# Patient Record
Sex: Male | Born: 1995 | Hispanic: No | Marital: Single | Smoking: Never smoker
Health system: Southern US, Community
[De-identification: ages and names within clinical notes are randomized; demographics above are authoritative.]

## PROBLEM LIST (undated history)

## (undated) DIAGNOSIS — F840 Autistic disorder: Secondary | ICD-10-CM

---
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
## 2014-03-07 ENCOUNTER — Emergency Department (HOSPITAL_COMMUNITY)
Admission: EM | Admit: 2014-03-07 | Discharge: 2014-03-07 | Disposition: A | Discharge status: Home / Self Care | Payer: Medicaid Other | Attending: Emergency Medicine | Admitting: Emergency Medicine

## 2014-03-07 ENCOUNTER — Encounter (HOSPITAL_COMMUNITY): Payer: Self-pay | Admitting: Emergency Medicine

## 2014-03-07 DIAGNOSIS — H9209 Otalgia, unspecified ear: Secondary | ICD-10-CM | POA: Insufficient documentation

## 2014-03-07 DIAGNOSIS — H9202 Otalgia, left ear: Secondary | ICD-10-CM

## 2014-03-07 DIAGNOSIS — Z792 Long term (current) use of antibiotics: Secondary | ICD-10-CM | POA: Insufficient documentation

## 2014-03-07 DIAGNOSIS — F840 Autistic disorder: Secondary | ICD-10-CM | POA: Insufficient documentation

## 2014-03-07 DIAGNOSIS — Z79899 Other long term (current) drug therapy: Secondary | ICD-10-CM | POA: Insufficient documentation

## 2014-03-07 MED ORDER — HYDROCODONE-ACETAMINOPHEN 7.5-325 MG/15ML PO SOLN: 15.0000 mL | Freq: Three times a day (TID) | ORAL | Status: DC | PRN

## 2014-03-07 MED ORDER — AMOXICILLIN 250 MG PO CHEW: 500.0000 mg | CHEWABLE_TABLET | Freq: Three times a day (TID) | ORAL | Status: DC

## 2014-03-07 NOTE — ED Provider Notes (Signed)
CSN: [id number]     Arrival date & time 03/07/14  1913 History  This chart was scribed for non-physician practitioner Arthor Captain, PA-C  working with Toy Baker, MD by Elveria Rising, ED Scribe. This patient was seen in room TR05C/TR05C and the patient's care was started at 9:15 PM.   Chief Complaint  Patient presents with  . Otalgia     The history is provided by the patient. No language interpreter was used.   HPI Comments:  Alan Patterson is a 18 y.o. male with history of autism brought in by parents to the Emergency Department complaining of pulling and hitting his right ear for several days.  Patient is nonverbal and hx given by parents. Decreased sleep and agitation due to pain.Parents suspect the patient has an ear infection.      Past Medical History  Diagnosis Date  . Autism    History reviewed. No pertinent past surgical history. No family history on file. History  Substance Use Topics  . Smoking status: Never Smoker   . Smokeless tobacco: Not on file  . Alcohol Use: No    Review of Systems  Constitutional: Positive for activity change. Negative for fever and appetite change.  HENT: Positive for ear pain. Negative for ear discharge and hearing loss.   Psychiatric/Behavioral: Positive for agitation.      Allergies  Review of patient's allergies indicates no known allergies.  Home Medications   Prior to Admission medications   Medication Sig Start Date End Date Taking? Authorizing Provider  ARIPiprazole (ABILIFY) 10 MG tablet Take 10-20 mg by mouth 2 (two) times daily. 1 tablet in the morning and 2 tablets at bedtime    Historical Provider, MD  cloNIDine (CATAPRES) 0.1 MG tablet Take 0.1 mg by mouth 3 (three) times daily.    Historical Provider, MD  divalproex (DEPAKOTE SPRINKLE) 125 MG capsule Take 250 mg by mouth 4 (four) times daily.     Historical Provider, MD  doxycycline (VIBRAMYCIN) 100 MG capsule Take 1 capsule (100 mg total) by mouth 2 (two)  times daily. 09/15/12   Jamesetta Orleans Lawyer, PA-C  LORazepam (ATIVAN) 1 MG tablet Take 1 mg by mouth every 8 (eight) hours.    Historical Provider, MD   Triage Vitals: BP 121/75  Pulse 98  Temp(Src) 98.9 F (37.2 C) (Oral)  Resp 18  Ht 5\' 9"  (1.753 m)  Wt 200 lb (90.719 kg)  BMI 29.52 kg/m2  SpO2 96% Physical Exam  Nursing note and vitals reviewed. Constitutional: He is oriented to person, place, and time. He appears well-developed and well-nourished. No distress.  HENT:  Head: Normocephalic and atraumatic.  TMs obscured by waxy bilaterally cerumen. Draws away from speculum on left ear. 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  Neck: Neck supple. No tracheal deviation present.  Cardiovascular: Normal rate.   Pulmonary/Chest: Effort normal. No respiratory distress.  Musculoskeletal: Normal range of motion.  Neurological: He is alert and oriented to person, place, and time.  Skin: Skin is warm and dry.  Psychiatric: He has a normal mood and affect. His behavior is normal.    ED Course  Procedures (including critical care time) DIAGNOSTIC STUDIES: Oxygen Saturation is 96% on room air, adequate by my interpretation.    COORDINATION OF CARE: 9:15 PM- Discussed treatment plan with patient at bedside and patient agreed to plan.     Labs Review Labs Reviewed - No data to display  Imaging Review No results found.   EKG Interpretation None      MDM   Final diagnoses:  Otalgia of left ear    Patient with sig. Cerumen BL. Assume infection and will treat as patient is more and more agitated as time goes on ine ED. I have advised the parents that they will need to have his cerumen removed and advise ENT f/u. Patient pain and agitation prevent cerumen removal this evening.  I personally performed the services described in this documentation, which was scribed in my presence. The recorded information has been  reviewed and is accurate.    Arthor CaptainAbigail Braydn Carneiro, PA-C 03/12/14 2203

## 2014-03-07 NOTE — ED Notes (Signed)
Pt.'s parents reported that pt. is pulling/hitting his right ear for several days suspects ear infection , pt. is autistic , no drainage or fever .

## 2014-03-07 NOTE — ED Notes (Signed)
Pt dc with family

## 2014-03-07 NOTE — Discharge Instructions (Signed)
Otalgia °The most common reason for this in children is an infection of the middle ear. Pain from the middle ear is usually caused by a build-up of fluid and pressure behind the eardrum. Pain from an earache can be sharp, dull, or burning. The pain may be temporary or constant. The middle ear is connected to the nasal passages by a short narrow tube called the Eustachian tube. The Eustachian tube allows fluid to drain out of the middle ear, and helps keep the pressure in your ear equalized. °CAUSES  °A cold or allergy can block the Eustachian tube with inflammation and the build-up of secretions. This is especially likely in small children, because their Eustachian tube is shorter and more horizontal. When the Eustachian tube closes, the normal flow of fluid from the middle ear is stopped. Fluid can accumulate and cause stuffiness, pain, hearing loss, and an ear infection if germs start growing in this area. °SYMPTOMS  °The symptoms of an ear infection may include fever, ear pain, fussiness, increased crying, and irritability. Many children will have temporary and minor hearing loss during and right after an ear infection. Permanent hearing loss is rare, but the risk increases the more infections a child has. Other causes of ear pain include retained water in the outer ear canal from swimming and bathing. °Ear pain in adults is less likely to be from an ear infection. Ear pain may be referred from other locations. Referred pain may be from the joint between your jaw and the skull. It may also come from a tooth problem or problems in the neck. Other causes of ear pain include: °· A foreign body in the ear. °· Outer ear infection. °· Sinus infections. °· Impacted ear wax. °· Ear injury. °· Arthritis of the jaw or TMJ problems. °· Middle ear infection. °· Tooth infections. °· Sore throat with pain to the ears. °DIAGNOSIS  °Your caregiver can usually make the diagnosis by examining you. Sometimes other special studies,  including x-rays and lab work may be necessary. °TREATMENT  °· If antibiotics were prescribed, use them as directed and finish them even if you or your child's symptoms seem to be improved. °· Sometimes PE tubes are needed in children. These are little plastic tubes which are put into the eardrum during a simple surgical procedure. They allow fluid to drain easier and allow the pressure in the middle ear to equalize. This helps relieve the ear pain caused by pressure changes. °HOME CARE INSTRUCTIONS  °· Only take over-the-counter or prescription medicines for pain, discomfort, or fever as directed by your caregiver. DO NOT GIVE CHILDREN ASPIRIN because of the association of Reye's Syndrome in children taking aspirin. °· Use a cold pack applied to the outer ear for 15-20 minutes, 03-04 times per day or as needed may reduce pain. Do not apply ice directly to the skin. You may cause frost bite. °· Over-the-counter ear drops used as directed may be effective. Your caregiver may sometimes prescribe ear drops. °· Resting in an upright position may help reduce pressure in the middle ear and relieve pain. °· Ear pain caused by rapidly descending from high altitudes can be relieved by swallowing or chewing gum. Allowing infants to suck on a bottle during airplane travel can help. °· Do not smoke in the house or near children. If you are unable to quit smoking, smoke outside. °· Control allergies. °SEEK IMMEDIATE MEDICAL CARE IF:  °· You or your child are becoming sicker. °· Pain or fever   relief is not obtained with medicine.  You or your child's symptoms (pain, fever, or irritability) do not improve within 24 to 48 hours or as instructed.  Severe pain suddenly stops hurting. This may indicate a ruptured eardrum.  You or your children develop new problems such as severe headaches, stiff neck, difficulty swallowing, or swelling of the face or around the ear. Document Released: 05/05/2004 Document Revised: 12/11/2011  Document Reviewed: 09/09/2008 [facility] Patient Information 2014 [place], [place]. Amoxicillin chewable tablets What is this medicine? AMOXICILLIN (a mox i SIL in) is a penicillin antibiotic. It is used to treat certain kinds of bacterial infections. It will not work for colds, flu, or other viral infections. This medicine may be used for other purposes; ask your health care provider or pharmacist if you have questions. COMMON BRAND NAME(S): Amoxil What should I tell my health care provider before I take this medicine? They need to know if you have any of these conditions: -asthma -kidney disease -phenylketonuria -an unusual or allergic reaction to amoxicillin, other penicillins, cephalosporin antibiotics, other medicines, foods, dyes, or preservatives -pregnant or trying to get pregnant -breast-feeding How should I use this medicine? Take this medicine by mouth. Chew it completely before swallowing. Follow the directions on your prescription label. You may take this medicine with food or on an empty stomach. Take your medicine at regular intervals. Do not take your medicine more often than directed. Take all of your medicine as directed even if you think your are better. Do not skip doses or stop your medicine early. Talk to your pediatrician regarding the use of this medicine in children. While this drug may be prescribed for selected conditions, precautions do apply. Overdosage: If you think you have taken too much of this medicine contact a poison control center or emergency room at once. NOTE: This medicine is only for you. Do not share this medicine with others. What if I miss a dose? If you miss a dose, take it as soon as you can. If it is almost time for your next dose, take only that dose. Do not take double or extra doses. What may interact with this medicine? -amiloride -birth control pills -chloramphenicol -macrolides -probenecid -sulfonamides -tetracyclines This list may not  describe all possible interactions. Give your health care provider a list of all the medicines, herbs, non-prescription drugs, or dietary supplements you use. Also tell them if you smoke, drink alcohol, or use illegal drugs. Some items may interact with your medicine. What should I watch for while using this medicine? Tell your doctor or health care professional if your symptoms do not improve in 2 or 3 days. Take all of the doses of your medicine as directed. Do not skip doses or stop your medicine early. If you are diabetic, you may get a false positive result for sugar in your urine with certain brands of urine tests. Check with your doctor. Do not treat diarrhea with over-the-counter products. Contact your doctor if you have diarrhea that lasts more than 2 days or if the diarrhea is severe and watery. What side effects may I notice from receiving this medicine? Side effects that you should report to your doctor or health care professional as soon as possible: -allergic reactions like skin rash, itching or hives, swelling of the face, lips, or tongue -breathing problems -dark urine -redness, blistering, peeling or loosening of the skin, including inside the mouth -seizures -severe or watery diarrhea -trouble passing urine or change in the amount of urine -unusual  bleeding or bruising -unusually weak or tired -yellowing of the eyes or skin Side effects that usually do not require medical attention (report to your doctor or health care professional if they continue or are bothersome): -dizziness -headache -stomach upset -trouble sleeping This list may not describe all possible side effects. Call your doctor for medical advice about side effects. You may report side effects to FDA at 1-800-FDA-1088. Where should I keep my medicine? Keep out of the reach of children. Store at or below 77 degrees F (25 degrees C). Keep container tightly closed. Throw away any unused medicine after the  expiration date. NOTE: This sheet is a summary. It may not cover all possible information. If you have questions about this medicine, talk to your doctor, pharmacist, or health care provider.  2014, Elsevier/Gold Standard. (2007-12-12 11:35:07) Acetaminophen; Hydrocodone oral solution What is this medicine? ACETAMINOPHEN; HYDROCODONE (a set a MEE noe fen; hye droe KOE done) is a pain reliever. It is used to treat mild to moderate pain. This medicine may be used for other purposes; ask your health care provider or pharmacist if you have questions. COMMON BRAND NAME(S): Hycet, Liquicet, Lortab, Zamicet , Zolvit  What should I tell my health care provider before I take this medicine? They need to know if you have any of these conditions: -brain tumor -Crohn's disease, inflammatory bowel disease, or ulcerative colitis -drug abuse or addiction -head injury -heart or circulation problems -if you often drink alcohol -kidney disease or problems going to the bathroom -liver disease -lung disease, asthma, or breathing problems -an unusual or allergic reaction to acetaminophen, hydrocodone, other opioid analgesics, other medicines, foods, dyes, or preservatives -pregnant or trying to get pregnant -breast-feeding How should I use this medicine? Take this medicine by mouth. Use a specially marked spoon or dropper to measure your dose. Ask your pharmacist if you do not have a dropper or measuring spoon. Do not use a household spoon. Follow the directions on the prescription label. If the medicine upsets your stomach, take it with food or milk. Do not take more medicine than you are told to take. Talk to your pediatrician regarding the use of this medicine in children. This medicine is not approved for use in children. Overdosage: If you think you have taken too much of this medicine contact a poison control center or emergency room at once. NOTE: This medicine is only for you. Do not share this medicine  with others. What if I miss a dose? If you miss a dose, take it as soon as you can. If it is almost time for your next dose, take only that dose. Do not take double or extra doses. What may interact with this medicine? -alcohol -antihistamines -isoniazid -medicines for depression, anxiety, or psychotic disturbances -medicines for sleep -muscle relaxants -naltrexone -narcotic medicines (opiates) for pain -phenobarbital -ritonavir -tramadol This list may not describe all possible interactions. Give your health care provider a list of all the medicines, herbs, non-prescription drugs, or dietary supplements you use. Also tell them if you smoke, drink alcohol, or use illegal drugs. Some items may interact with your medicine. What should I watch for while using this medicine? Tell your doctor or health care professional if your pain does not go away, if it gets worse, or if you have new or a different type of pain. You may develop tolerance to the medicine. Tolerance means that you will need a higher dose of the medicine for pain relief. Tolerance is normal and is  expected if you take this medicine for a long time. Do not suddenly stop taking your medicine because you may develop a severe reaction. Your body becomes used to the medicine. This does NOT mean you are addicted. Addiction is a behavior related to getting and using a drug for a non-medical reason. If you have pain, you have a medical reason to take pain medicine. Your doctor will tell you how much medicine to take. If your doctor wants you to stop the medicine, the dose will be slowly lowered over time to avoid any side effects. You may get drowsy or dizzy when you first start taking the medicine or change doses. Do not drive, use machinery, or do anything that may be dangerous until you know how the medicine affects you. Stand or sit up slowly. There are different types of narcotic medicines (opiates) for pain. If you take more than one  type at the same time, you may have more side effects. Give your health care provider a list of all medicines you use. Your doctor will tell you how much medicine to take. Do not take more medicine than directed. Call emergency for help if you have problems breathing. The medicine will cause constipation. Try to have a bowel movement at least every 2 to 3 days. If you do not have a bowel movement for 3 days, call your doctor or health care professional. Too much acetaminophen can be very dangerous. Do not take Tylenol (acetaminophen) or medicines that contain acetaminophen with this medicine. Many non-prescription medicines contain acetaminophen. Always read the labels carefully. What side effects may I notice from receiving this medicine? Side effects that you should report to your doctor or health care professional as soon as possible: -allergic reactions like skin rash, itching or hives, swelling of the face, lips, or tongue -breathing problems -confusion -feeling faint or lightheaded, falls -stomach pain -yellowing of the eyes or skin Side effects that usually do not require medical attention (report to your doctor or health care professional if they continue or are bothersome): -nausea, vomiting -stomach upset This list may not describe all possible side effects. Call your doctor for medical advice about side effects. You may report side effects to FDA at 1-800-FDA-1088. Where should I keep my medicine? Keep out of the reach of children. This medicine can be abused. Keep your medicine in a safe place to protect it from theft. Do not share this medicine with anyone. Selling or giving away this medicine is dangerous and against the law. Store at room temperature between 20 and 25 degrees C (68 and 77 degrees F). Protect from light. Keep container tightly closed.  Throw away any unused medicine after the expiration date. Discard unused medicine and used packaging carefully. Pets and children can  be harmed if they find used or lost packages. NOTE: This sheet is a summary. It may not cover all possible information. If you have questions about this medicine, talk to your doctor, pharmacist, or health care provider.  2014, Elsevier/Gold Standard. (2013-05-12 13:15:28)

## 2014-03-13 NOTE — ED Provider Notes (Signed)
Medical screening examination/treatment/procedure(s) were performed by non-physician practitioner and as supervising physician I was immediately available for consultation/collaboration.  Usama Harkless T Avalynn Bowe, MD 03/13/14 0709 

## 2014-04-30 ENCOUNTER — Emergency Department (HOSPITAL_COMMUNITY)
Admission: EM | Admit: 2014-04-30 | Discharge: 2014-04-30 | Disposition: A | Discharge status: Home / Self Care | Payer: Medicaid Other | Attending: Emergency Medicine | Admitting: Emergency Medicine

## 2014-04-30 ENCOUNTER — Encounter (HOSPITAL_COMMUNITY): Payer: Self-pay | Admitting: Emergency Medicine

## 2014-04-30 DIAGNOSIS — Z792 Long term (current) use of antibiotics: Secondary | ICD-10-CM | POA: Insufficient documentation

## 2014-04-30 DIAGNOSIS — R569 Unspecified convulsions: Secondary | ICD-10-CM | POA: Diagnosis present

## 2014-04-30 DIAGNOSIS — Z79899 Other long term (current) drug therapy: Secondary | ICD-10-CM | POA: Diagnosis not present

## 2014-04-30 DIAGNOSIS — R1110 Vomiting, unspecified: Secondary | ICD-10-CM | POA: Diagnosis not present

## 2014-04-30 DIAGNOSIS — F840 Autistic disorder: Secondary | ICD-10-CM | POA: Diagnosis not present

## 2014-04-30 LAB — COMPREHENSIVE METABOLIC PANEL
ALK PHOS: 76 U/L (ref 39–117)
ALT: 30 U/L (ref 0–53)
ANION GAP: 15 (ref 5–15)
AST: 27 U/L (ref 0–37)
Albumin: 4 g/dL (ref 3.5–5.2)
BUN: 14 mg/dL (ref 6–23)
CHLORIDE: 103 meq/L (ref 96–112)
CO2: 22 mEq/L (ref 19–32)
Calcium: 9.5 mg/dL (ref 8.4–10.5)
Creatinine, Ser: 0.49 mg/dL — ABNORMAL LOW (ref 0.50–1.35)
GFR calc non Af Amer: >90 mL/min (ref >90)
GLUCOSE: 97 mg/dL (ref 70–99)
POTASSIUM: 4.3 meq/L (ref 3.7–5.3)
SODIUM: 140 meq/L (ref 137–147)
TOTAL PROTEIN: 7.6 g/dL (ref 6.0–8.3)
Total Bilirubin: 1 mg/dL (ref 0.3–1.2)

## 2014-04-30 LAB — VALPROIC ACID LEVEL: Valproic Acid Lvl: <10 ug/mL — ABNORMAL LOW (ref 50.0–100.0)

## 2014-04-30 MED ORDER — LORAZEPAM 2 MG/ML IJ SOLN
1.0000 mg | Freq: Once | INTRAMUSCULAR | Status: AC
  Administered 2014-04-30: 1 mg via INTRAMUSCULAR
  Filled 2014-04-30: qty 1

## 2014-04-30 MED ORDER — DIVALPROEX SODIUM 125 MG PO CPSP: 500.0000 mg | ORAL_CAPSULE | Freq: Once | ORAL | Status: DC

## 2014-04-30 MED ORDER — DIVALPROEX SODIUM 125 MG PO CPSP
500.0000 mg | ORAL_CAPSULE | Freq: Once | ORAL | Status: DC
  Filled 2014-04-30: qty 4

## 2014-04-30 MED ORDER — ONDANSETRON 4 MG PO TBDP
4.0000 mg | ORAL_TABLET | Freq: Once | ORAL | Status: AC
  Administered 2014-04-30: 4 mg via ORAL
  Filled 2014-04-30: qty 1

## 2014-04-30 MED ORDER — ONDANSETRON HCL 4 MG/2ML IJ SOLN: 4.0000 mg | Freq: Once | INTRAMUSCULAR | Status: DC

## 2014-04-30 MED ORDER — HOME MED STORE IN PYXIS
1.0000 | Freq: Once | Status: AC
  Administered 2014-04-30: 1 via ORAL

## 2014-04-30 NOTE — ED Notes (Signed)
depakote sprinkle 500 mg caps of pts home med given pts father requested pt able to take his home med

## 2014-04-30 NOTE — Discharge Instructions (Signed)
Take 500mg  of depakote tonight before bed.  Call your neurologist for follow up.  Return for repeat seizure.  Seizure, Adult A seizure means there is unusual activity in the brain. A seizure can cause changes in attention or behavior. Seizures often cause shaking (convulsions). Seizures often last from 30 seconds to 2 minutes. HOME CARE   If you are given medicines, take them exactly as told by your doctor.  Keep all doctor visits as told.  Do not swim or drive until your doctor says it is okay.  Teach others what to do if you have a seizure. They should:  Lay you on the ground.  Put a cushion under your head.  Loosen any tight clothing around your neck.  Turn you on your side.  Stay with you until you get better. GET HELP RIGHT AWAY IF:   The seizure lasts longer than 2 to 5 minutes.  The seizure is very bad.  The person does not wake up after the seizure.  The person's attention or behavior changes. Drive the person to the emergency room or call your local emergency services (911 in [place] MAKE SURE YOU:   Understand these instructions.  Will watch your condition.  Will get help right away if you are not doing well or get worse. Document Released: 03/06/2008 Document Revised: 12/11/2011 Document Reviewed: 09/06/2011 [facility] Patient Information 2015 [place], [place]. This information is not intended to replace advice given to you by your health care provider. Make sure you discuss any questions you have with your health care provider.

## 2014-04-30 NOTE — ED Notes (Signed)
Pt sleeping when hand/arm touched pt pulls arm back toward himself forcefully

## 2014-04-30 NOTE — ED Provider Notes (Signed)
CSN: [id number]     Arrival date & time 04/30/14  1504 History   First MD Initiated Contact with Patient 04/30/14 1515     Chief Complaint  Patient presents with  . Seizures     (Consider location/radiation/quality/duration/timing/severity/associated sxs/prior Treatment) Patient is a 18 y.o. male presenting with seizures. The history is provided by the patient.  Seizures Seizure activity on arrival: no   Seizure type:  Grand mal Initial focality:  None Episode characteristics: abnormal movements, generalized shaking, tongue biting and unresponsiveness   Postictal symptoms: somnolence   Return to baseline: yes   Severity:  Moderate Duration:  7 minutes Timing:  Once Progression:  Unchanged Context: not alcohol withdrawal, not intracranial lesion and not intracranial shunt   Recent head injury:  No recent head injuries PTA treatment:  None History of seizures: yes (6 years ago, since cleared and off medications)     Past Medical History  Diagnosis Date  . Autism    History reviewed. No pertinent past surgical history. History reviewed. No pertinent family history. History  Substance Use Topics  . Smoking status: Never Smoker   . Smokeless tobacco: Not on file  . Alcohol Use: No    Review of Systems  Constitutional: Negative for fever and chills.  HENT: Negative for congestion and facial swelling.   Eyes: Negative for discharge and visual disturbance.  Respiratory: Negative for shortness of breath.   Cardiovascular: Negative for chest pain and palpitations.  Gastrointestinal: Positive for nausea and vomiting. Negative for abdominal pain and diarrhea.  Musculoskeletal: Negative for arthralgias and myalgias.  Skin: Negative for color change and rash.  Neurological: Positive for seizures. Negative for tremors, syncope and headaches.  Psychiatric/Behavioral: Negative for confusion and dysphoric mood.      Allergies  Review of patient's allergies indicates no known  allergies.  Home Medications   Prior to Admission medications   Medication Sig Start Date End Date Taking? Authorizing Provider  amoxicillin (AMOXIL) 250 MG chewable tablet Chew 2 tablets (500 mg total) by mouth 3 (three) times daily. 03/07/14   Arthor Captain, PA-C  ARIPiprazole (ABILIFY) 10 MG tablet Take 10-20 mg by mouth 2 (two) times daily. 1 tablet in the morning and 2 tablets at bedtime    Historical Provider, MD  cloNIDine (CATAPRES) 0.1 MG tablet Take 0.1 mg by mouth 3 (three) times daily.    Historical Provider, MD  divalproex (DEPAKOTE SPRINKLE) 125 MG capsule Take 250 mg by mouth 4 (four) times daily.     Historical Provider, MD  doxycycline (VIBRAMYCIN) 100 MG capsule Take 1 capsule (100 mg total) by mouth 2 (two) times daily. 09/15/12   Jamesetta Orleans Lawyer, PA-C  HYDROcodone-acetaminophen (HYCET) 7.5-325 mg/15 ml solution Take 15 mLs by mouth every 8 (eight) hours as needed for moderate pain. 03/07/14   Arthor Captain, PA-C  LORazepam (ATIVAN) 1 MG tablet Take 1 mg by mouth every 8 (eight) hours.    Historical Provider, MD   BP 124/77  Pulse 80  Resp 19  SpO2 99% Physical Exam  Constitutional: He appears well-developed and well-nourished.  HENT:  Head: Normocephalic and atraumatic.  Eyes: EOM are normal. Pupils are equal, round, and reactive to light.  Neck: Normal range of motion. Neck supple. No JVD present.  Cardiovascular: Normal rate and regular rhythm.  Exam reveals no gallop and no friction rub.   No murmur heard. Pulmonary/Chest: No respiratory distress. He has no wheezes.  Abdominal: He exhibits no distension. There is no rebound and  no guarding.  Musculoskeletal: Normal range of motion.  Neurological: He is alert. GCS eye subscore is 4. GCS verbal subscore is 1. GCS motor subscore is 6.  Patient tracks throughout room, localizing to stimuli, follows commands.  Non verbal  Skin: No rash noted. No pallor.  Psychiatric: He has a normal mood and affect. His behavior  is normal.    ED Course  Procedures (including critical care time) Labs Review Labs Reviewed  VALPROIC ACID LEVEL - Abnormal; Notable for the following:    Valproic Acid Lvl <10.0 (*)    All other components within normal limits  COMPREHENSIVE METABOLIC PANEL - Abnormal; Notable for the following:    Creatinine, Ser 0.49 (*)    All other components within normal limits  CBC WITH DIFFERENTIAL    Imaging Review No results found.   EKG Interpretation None      MDM   Final diagnoses:  Seizure  On valproic acid therapy    Patient is a 18 y.o. male who presents with grand mal seizure.  This started just prior to arrival.  The patient was at home, walked up the stairs and then had a tonic clonic like activity.  Eyes rolled back in head.  Lasted for about 7 min.  Hx of one seizure in the past, though had [facility] about three years [facility].  On depakote for autism, will check level.  Vomiting post event, continues in the ED.  No hx of abdominal pain, fever.  Denies recent head injury.  Back to baseline.  Will check cbc, cmp, depakote lvl. Ativan and zofran given.    Patient with unmeasurable Depakote level. Patient will load her Dr. Thad Rangereynolds from neurology with 500 mg now and 500 mg tonight. Patient then will resumed their normal dosing.   Follow up with their neurologist.   5:50 PM:  I have discussed the diagnosis/risks/treatment options with the family and believe the pt to be eligible for discharge home to follow-up with their neurologist. We also discussed returning to the ED immediately if new or worsening sx occur. We discussed the sx which are most concerning (e.g., repeat seizure) that necessitate immediate return. Medications administered to the patient during their visit and any new prescriptions provided to the patient are listed below.  Medications given during this visit Medications  divalproex (DEPAKOTE SPRINKLE) capsule 500 mg (not  administered)  LORazepam (ATIVAN) injection 1 mg (1 mg Intramuscular Given 04/30/14 1536)  ondansetron (ZOFRAN-ODT) disintegrating tablet 4 mg (4 mg Oral Given 04/30/14 1636)    New Prescriptions   No medications on file       Melene Planan Khristopher Kapaun, MD 04/30/14 1752

## 2014-04-30 NOTE — ED Notes (Signed)
3 staff required to hold pts arm for im injection pt autistic

## 2014-04-30 NOTE — ED Notes (Signed)
Per family pt had a seizure this am. sts hx of one 6 years ago. sts after he vomited. Pt autistic.

## 2014-04-30 NOTE — ED Notes (Signed)
Blood drawn with staff x4 to hold pt pts father at bedside to calm pt

## 2014-04-30 NOTE — ED Notes (Signed)
Received call from lab tech not enough blood in [facility] informed and aware of difficulty obtaining blood specimens no further order no request for addl draw

## 2014-05-01 NOTE — ED Provider Notes (Signed)
I saw and evaluated the patient, reviewed the resident's note and I agree with the findings and plan.   EKG Interpretation None      Alan Patterson is a 18 y.o. male hx of seizure, autism here with possible seizure. Tonic clonic seizure as per father. Patient nonverbal at baseline. He is on depakote for autism. Last seizure was 6 years ago. Nonverbal, moving all extremities. Mental status at baseline per father. Depakote level subtherapeutic. Labs otherwise unremarkable. Difficult to get IV due to autism. Called Alan Patterson's who recommend giving him depakote 500 mg now and tonight and resume usual dose. He has neurology f/u.    Alan Canalavid H Yao, MD 05/01/14 [phone]

## 2014-06-16 ENCOUNTER — Emergency Department (HOSPITAL_COMMUNITY)
Admission: EM | Admit: 2014-06-16 | Discharge: 2014-06-16 | Disposition: A | Discharge status: Home / Self Care | Payer: Medicaid Other | Attending: Emergency Medicine | Admitting: Emergency Medicine

## 2014-06-16 ENCOUNTER — Emergency Department (HOSPITAL_COMMUNITY)
Admission: EM | Admit: 2014-06-16 | Discharge: 2014-06-16 | Disposition: A | Discharge status: Home Health | Payer: Medicaid Other | Source: Home / Self Care

## 2014-06-16 ENCOUNTER — Emergency Department (HOSPITAL_COMMUNITY): Payer: Medicaid Other

## 2014-06-16 ENCOUNTER — Encounter (HOSPITAL_COMMUNITY): Payer: Self-pay | Admitting: Emergency Medicine

## 2014-06-16 DIAGNOSIS — Y9389 Activity, other specified: Secondary | ICD-10-CM | POA: Insufficient documentation

## 2014-06-16 DIAGNOSIS — Z23 Encounter for immunization: Secondary | ICD-10-CM | POA: Insufficient documentation

## 2014-06-16 DIAGNOSIS — F840 Autistic disorder: Secondary | ICD-10-CM | POA: Diagnosis not present

## 2014-06-16 DIAGNOSIS — Y9289 Other specified places as the place of occurrence of the external cause: Secondary | ICD-10-CM | POA: Diagnosis not present

## 2014-06-16 DIAGNOSIS — S91309A Unspecified open wound, unspecified foot, initial encounter: Secondary | ICD-10-CM | POA: Insufficient documentation

## 2014-06-16 DIAGNOSIS — S90852A Superficial foreign body, left foot, initial encounter: Secondary | ICD-10-CM

## 2014-06-16 DIAGNOSIS — Z79899 Other long term (current) drug therapy: Secondary | ICD-10-CM | POA: Insufficient documentation

## 2014-06-16 DIAGNOSIS — W268XXA Contact with other sharp object(s), not elsewhere classified, initial encounter: Secondary | ICD-10-CM | POA: Insufficient documentation

## 2014-06-16 MED ORDER — CIPROFLOXACIN HCL 500 MG PO TABS: 500.0000 mg | ORAL_TABLET | Freq: Two times a day (BID) | ORAL | Status: DC

## 2014-06-16 MED ORDER — HYDROMORPHONE HCL PF 1 MG/ML IJ SOLN
1.0000 mg | Freq: Once | INTRAMUSCULAR | Status: AC
  Administered 2014-06-16: 1 mg via INTRAVENOUS
  Filled 2014-06-16: qty 1

## 2014-06-16 MED ORDER — BACITRACIN 500 UNIT/GM EX OINT
1.0000 "application " | TOPICAL_OINTMENT | Freq: Two times a day (BID) | CUTANEOUS | Status: DC
  Administered 2014-06-16: 1 via TOPICAL
  Filled 2014-06-16: qty 14

## 2014-06-16 MED ORDER — NAPROXEN 500 MG PO TABS: 500.0000 mg | ORAL_TABLET | Freq: Two times a day (BID) | ORAL | Status: AC

## 2014-06-16 MED ORDER — TETANUS-DIPHTH-ACELL PERTUSSIS 5-2.5-18.5 LF-MCG/0.5 IM SUSP
0.5000 mL | Freq: Once | INTRAMUSCULAR | Status: AC
  Administered 2014-06-16: 0.5 mL via INTRAMUSCULAR
  Filled 2014-06-16: qty 0.5

## 2014-06-16 NOTE — ED Notes (Signed)
Dr. Hyacinth Meeker removed glass from left foot.

## 2014-06-16 NOTE — ED Provider Notes (Signed)
CSN: [id number]     Arrival date & time 06/16/14  2013 History   First MD Initiated Contact with Patient 06/16/14 2117     Chief Complaint  Patient presents with  . Foot Pain  . Foreign Body     (Consider location/radiation/quality/duration/timing/severity/associated sxs/prior Treatment) HPI Comments: 18 year old male autistic, nonverbal presents after stepping on a piece of broken glass at home with acute onset of pain to the left foot which has been persistent, associated with small amount of bleeding. No other information was acquired from the patient as he is nonverbal. He was given intramuscular pain medication in triage with significant improvement in his pain.  Patient is a 18 y.o. male presenting with lower extremity pain and foreign body. The history is provided by the patient.  Foot Pain  Foreign Body Associated symptoms: no vomiting     Past Medical History  Diagnosis Date  . Autism   . Autism    History reviewed. No pertinent past surgical history. No family history on file. History  Substance Use Topics  . Smoking status: Never Smoker   . Smokeless tobacco: Not on file  . Alcohol Use: No    Review of Systems  Gastrointestinal: Negative for vomiting.  Skin: Positive for wound.      Allergies  Review of patient's allergies indicates no known allergies.  Home Medications   Prior to Admission medications   Medication Sig Start Date End Date Taking? Authorizing Provider  cloNIDine (CATAPRES) 0.1 MG tablet Take 0.1 mg by mouth 3 (three) times daily.   Yes Historical Provider, MD  divalproex (DEPAKOTE SPRINKLE) 125 MG capsule Take 250 mg by mouth 4 (four) times daily.    Yes Historical Provider, MD  LORazepam (ATIVAN) 1 MG tablet Take 1 mg by mouth every 8 (eight) hours.   Yes Historical Provider, MD  ciprofloxacin (CIPRO) 500 MG tablet Take 1 tablet (500 mg total) by mouth 2 (two) times daily. 06/16/14   Vida Roller, MD  naproxen (NAPROSYN) 500 MG tablet  Take 1 tablet (500 mg total) by mouth 2 (two) times daily with a meal. 06/16/14   Vida Roller, MD   BP 118/58  Pulse 82  SpO2 97% Physical Exam  Nursing note and vitals reviewed. Constitutional: He appears well-developed and well-nourished.  HENT:  Head: Normocephalic and atraumatic.  Eyes: Conjunctivae are normal. Right eye exhibits no discharge. Left eye exhibits no discharge.  Pulmonary/Chest: Effort normal. No respiratory distress.  Musculoskeletal: He exhibits tenderness.  On the bottom of the foot, left side, foreign body present inside wound, deep. Not visualized at the surface. Tender to palpation, no bleeding  Neurological: He is alert. Coordination normal.  Skin: Skin is warm and dry. He is not diaphoretic.  Psychiatric: He has a normal mood and affect.    ED Course  FOREIGN BODY REMOVAL Date/Time: 06/16/2014 10:28 PM Performed by: Eber Hong D Authorized by: Eber Hong D Consent: Verbal consent obtained. written consent not obtained. Risks and benefits: risks, benefits and alternatives were discussed Consent given by: parent Patient understanding: patient states understanding of the procedure being performed Time out: Immediately prior to procedure a "time out" was called to verify the correct patient, procedure, equipment, support staff and site/side marked as required. Body area: skin General location: lower extremity Location details: left foot Patient sedated: no Patient restrained: yes Patient cooperative: no Localization method: visualized Removal mechanism: forceps Dressing: dressing applied Tendon involvement: none Depth: subcutaneous Complexity: simple 1 objects recovered. Objects recovered:  glass shard Post-procedure assessment: foreign body removed Patient tolerance: Patient tolerated the procedure well with no immediate complications.   (including critical care time) Labs Review Labs Reviewed - No data to display  Imaging Review Dg Foot  Complete Left  06/16/2014   CLINICAL DATA:  Foreign body. Glass. Region of interest is in the lateral fifth metatarsal region.  EXAM: LEFT FOOT - COMPLETE 3+ VIEW  COMPARISON:  None.  FINDINGS: There is a linear structure overlying the soft tissues of the foot just lateral to the cuboid. This measures 7 mm in length and 1 mm in width. Foreign body appears superficial in location. No acute fracture or subluxation.  IMPRESSION: Superficial linear foreign body as described.   Electronically Signed   By: Rosalie Gums M.D.   On: 06/16/2014 21:45   Dg Foot Complete Right  06/16/2014   CLINICAL DATA:  Stepped on glass.  EXAM: RIGHT FOOT COMPLETE - 3+ VIEW  COMPARISON:  None.  FINDINGS: There is no evidence of fracture or dislocation. There is no evidence of arthropathy or other focal bone abnormality. Soft tissues are unremarkable. No radial 8 are bodies identified appear  IMPRESSION: No acute bony abnormality.  No radiopaque foreign body identified.   Electronically Signed   By: Britta Mccreedy M.D.   On: 06/16/2014 21:28     EKG Interpretation None      MDM   Final diagnoses:  Foreign body in left foot, initial encounter    Foreign body removed under direct visualization with forceps, initial x-ray confirmed 1 foreign body, post removal x-ray ordered to ensure complete removal of foreign body. Due to the location of the patient's symptoms he will need to have antibiotic treatment to prevent infection. Otherwise he appears benign.  X-ray shows no signs of foreign body, patient stable for discharge  Meds given in ED:  Medications  bacitracin ointment 1 application (not administered)  HYDROmorphone (DILAUDID) injection 1 mg (1 mg Intravenous Given 06/16/14 2103)  Tdap (BOOSTRIX) injection 0.5 mL (0.5 mLs Intramuscular Given 06/16/14 2210)    New Prescriptions   CIPROFLOXACIN (CIPRO) 500 MG TABLET    Take 1 tablet (500 mg total) by mouth 2 (two) times daily.   NAPROXEN (NAPROSYN) 500 MG TABLET     Take 1 tablet (500 mg total) by mouth 2 (two) times daily with a meal.        Vida Roller, MD 06/16/14 2311

## 2014-06-16 NOTE — Discharge Instructions (Signed)
Please call your doctor for a followup appointment within 24-48 hours. When you talk to your doctor please let them know that you were seen in the emergency department and have them acquire all of your records so that they can discuss the findings with you and formulate a treatment plan to fully care for your new and ongoing problems. ° °

## 2014-06-16 NOTE — ED Notes (Addendum)
Here with parents. Child is autistic. English not first language. Speaks English. Child emotional, restless, loud and aggitated. Pt will pinch and grab others. Uses fingernails. Bites self. Slapping floor,hitting walls. Here for noticeable glass in sole of R foot. Bleeding controlled.

## 2014-06-16 NOTE — ED Notes (Signed)
Pt transported to xray 

## 2017-05-09 ENCOUNTER — Emergency Department (HOSPITAL_COMMUNITY): Payer: Medicare Other

## 2017-05-09 ENCOUNTER — Encounter (HOSPITAL_COMMUNITY): Payer: Self-pay | Admitting: Emergency Medicine

## 2017-05-09 DIAGNOSIS — R112 Nausea with vomiting, unspecified: Secondary | ICD-10-CM | POA: Insufficient documentation

## 2017-05-09 DIAGNOSIS — F840 Autistic disorder: Secondary | ICD-10-CM | POA: Insufficient documentation

## 2017-05-09 DIAGNOSIS — R509 Fever, unspecified: Secondary | ICD-10-CM | POA: Diagnosis present

## 2017-05-09 DIAGNOSIS — J189 Pneumonia, unspecified organism: Secondary | ICD-10-CM | POA: Diagnosis not present

## 2017-05-09 DIAGNOSIS — Z79899 Other long term (current) drug therapy: Secondary | ICD-10-CM | POA: Diagnosis not present

## 2017-05-09 LAB — URINALYSIS, ROUTINE W REFLEX MICROSCOPIC
BACTERIA UA: NONE SEEN
Bilirubin Urine: NEGATIVE
Glucose, UA: NEGATIVE mg/dL
HGB URINE DIPSTICK: NEGATIVE
Ketones, ur: NEGATIVE mg/dL
Leukocytes, UA: NEGATIVE
NITRITE: NEGATIVE
Protein, ur: 30 mg/dL — AB
SPECIFIC GRAVITY, URINE: 1.028 (ref 1.005–1.030)
pH: 5 (ref 5.0–8.0)

## 2017-05-09 LAB — CBC WITH DIFFERENTIAL/PLATELET
Basophils Absolute: 0 10*3/uL (ref 0.0–0.1)
Basophils Relative: 0 %
Eosinophils Absolute: 0 10*3/uL (ref 0.0–0.7)
Eosinophils Relative: 0 %
HEMATOCRIT: 43.1 % (ref 39.0–52.0)
Hemoglobin: 14.3 g/dL (ref 13.0–17.0)
LYMPHS ABS: 1.2 10*3/uL (ref 0.7–4.0)
Lymphocytes Relative: 17 %
MCH: 26.1 pg (ref 26.0–34.0)
MCHC: 33.2 g/dL (ref 30.0–36.0)
MCV: 78.8 fL (ref 78.0–100.0)
MONO ABS: 1.4 10*3/uL — AB (ref 0.1–1.0)
MONOS PCT: 20 %
NEUTROS ABS: 4.3 10*3/uL (ref 1.7–7.7)
NEUTROS PCT: 63 %
Platelets: 212 10*3/uL (ref 150–400)
RBC: 5.47 MIL/uL (ref 4.22–5.81)
RDW: 14.5 % (ref 11.5–15.5)
WBC: 6.9 10*3/uL (ref 4.0–10.5)

## 2017-05-09 LAB — COMPREHENSIVE METABOLIC PANEL
ALT: 44 U/L (ref 17–63)
ANION GAP: 9 (ref 5–15)
AST: 37 U/L (ref 15–41)
Albumin: 3.7 g/dL (ref 3.5–5.0)
Alkaline Phosphatase: 55 U/L (ref 38–126)
BILIRUBIN TOTAL: 1.1 mg/dL (ref 0.3–1.2)
BUN: 13 mg/dL (ref 6–20)
CO2: 23 mmol/L (ref 22–32)
Calcium: 8.9 mg/dL (ref 8.9–10.3)
Chloride: 103 mmol/L (ref 101–111)
Creatinine, Ser: 0.99 mg/dL (ref 0.61–1.24)
GFR calc Af Amer: >60 mL/min (ref >60)
GFR calc non Af Amer: >60 mL/min (ref >60)
GLUCOSE: 109 mg/dL — AB (ref 65–99)
Potassium: 3.4 mmol/L — ABNORMAL LOW (ref 3.5–5.1)
Sodium: 135 mmol/L (ref 135–145)
Total Protein: 7 g/dL (ref 6.5–8.1)

## 2017-05-09 LAB — I-STAT CG4 LACTIC ACID, ED: Lactic Acid, Venous: 1.87 mmol/L (ref 0.5–1.9)

## 2017-05-09 LAB — PROTIME-INR
INR: 1.22
Prothrombin Time: 15.4 seconds — ABNORMAL HIGH (ref 11.4–15.2)

## 2017-05-09 MED ORDER — ACETAMINOPHEN 325 MG PO TABS
ORAL_TABLET | ORAL | Status: AC
  Administered 2017-05-09: 650 mg
  Filled 2017-05-09: qty 2

## 2017-05-09 NOTE — ED Triage Notes (Signed)
Pt having fever, chills and vomiting since this morning refusing to drink or eat at home. Autism pt.

## 2017-05-10 ENCOUNTER — Emergency Department (HOSPITAL_COMMUNITY): Payer: Medicare Other

## 2017-05-10 ENCOUNTER — Emergency Department (HOSPITAL_COMMUNITY)
Admission: EM | Admit: 2017-05-10 | Discharge: 2017-05-10 | Disposition: A | Discharge status: Home / Self Care | Payer: Medicare Other | Attending: Emergency Medicine | Admitting: Emergency Medicine

## 2017-05-10 DIAGNOSIS — R110 Nausea: Secondary | ICD-10-CM

## 2017-05-10 DIAGNOSIS — J189 Pneumonia, unspecified organism: Secondary | ICD-10-CM

## 2017-05-10 DIAGNOSIS — R1110 Vomiting, unspecified: Secondary | ICD-10-CM

## 2017-05-10 DIAGNOSIS — R509 Fever, unspecified: Secondary | ICD-10-CM

## 2017-05-10 MED ORDER — ONDANSETRON HCL 4 MG/2ML IJ SOLN
4.0000 mg | Freq: Once | INTRAMUSCULAR | Status: AC
  Administered 2017-05-10: 4 mg via INTRAVENOUS
  Filled 2017-05-10: qty 2

## 2017-05-10 MED ORDER — SODIUM CHLORIDE 0.9 % IV BOLUS (SEPSIS)
1500.0000 mL | Freq: Once | INTRAVENOUS | Status: AC
  Administered 2017-05-10: 1500 mL via INTRAVENOUS

## 2017-05-10 MED ORDER — ACETAMINOPHEN 325 MG PO TABS
650.0000 mg | ORAL_TABLET | Freq: Once | ORAL | Status: AC
  Administered 2017-05-10: 650 mg via ORAL
  Filled 2017-05-10: qty 2

## 2017-05-10 MED ORDER — SODIUM CHLORIDE 0.9 % IV BOLUS (SEPSIS): 1000.0000 mL | Freq: Once | INTRAVENOUS | Status: DC

## 2017-05-10 MED ORDER — LEVOFLOXACIN 25 MG/ML PO SOLN: 500.0000 mg | Freq: Every day | ORAL | 0 refills | Status: AC

## 2017-05-10 NOTE — ED Notes (Signed)
Pt refusing treatment at this time; Father at bedside states patient is autistic and non-verbal

## 2017-05-10 NOTE — ED Notes (Signed)
Patient transported to X-ray 

## 2017-05-10 NOTE — ED Provider Notes (Signed)
[facility]-EMERGENCY DEPT Provider Note   CSN: [id number] Arrival date & time: 05/09/17  2031     History   Chief Complaint Chief Complaint  Patient presents with  . Fever  . Emesis   Level 5 caveat: Nonverbal  HPI Alan Patterson is a 21 y.o. male.  HPI Patients at 21 year old nonverbal male with autism who is brought to the emergency department by his father and found to have a fever 103.1.  A one episode of vomiting 2 days ago and since then has had mild decreased oral intake.  He is nonverbal substance difficult for the father to ascertain if he is having pain anywhere.  He's had no diarrhea.  There's been no reported rash.   Past Medical History:  Diagnosis Date  . Autism   . Autism     There are no active problems to display for this patient.   History reviewed. No pertinent surgical history.     Home Medications    Prior to Admission medications   Medication Sig Start Date End Date Taking? Authorizing Provider  ARIPiprazole (ABILIFY) 10 MG tablet Take 5 mg by mouth 2 (two) times daily. 04/10/17  Yes [provider]  cholecalciferol (VITAMIN D) 1000 units tablet Take 1,000 Units by mouth daily. 04/10/17  Yes [provider]  propranolol (INDERAL) 20 MG tablet Take 20 mg by mouth 2 (two) times daily. 04/10/17  Yes [provider]  topiramate (TOPAMAX) 25 MG capsule Take 75 mg by mouth 2 (two) times daily. 05/08/17  Yes [provider]  ciprofloxacin (CIPRO) 500 MG tablet Take 1 tablet (500 mg total) by mouth 2 (two) times daily. Patient not taking: Reported on 05/10/2017 06/16/14   Eber HongMiller, Brian, MD  levofloxacin [facility](LEVAQUIN) 25 MG/ML solution Take 20 mLs (500 mg total) by mouth daily. 05/10/17 05/17/17  Azalia Bilisampos, Lindzie Boxx, MD  naproxen (NAPROSYN) 500 MG tablet Take 1 tablet (500 mg total) by mouth 2 (two) times daily with a meal. Patient not taking: Reported on 05/10/2017 06/16/14   Eber HongMiller, Brian, MD    Family History History reviewed. No pertinent  family history.  Social History Social History  Substance Use Topics  . Smoking status: Never Smoker  . Smokeless tobacco: Never Used  . Alcohol use No     Allergies   Patient has no known allergies.   Review of Systems Review of Systems  Unable to perform ROS: Patient nonverbal     Physical Exam Updated Vital Signs BP 122/72   Pulse 76   Temp (!) 100.5 F (38.1 C) (Axillary)   Resp 16   Ht 5\' 10"  (1.778 m)   Wt 101.5 kg (223 lb 11.2 oz)   SpO2 98%   BMI 32.10 kg/m   Physical Exam  Constitutional: He appears well-developed and well-nourished.  HENT:  Head: Normocephalic and atraumatic.  Eyes: EOM are normal.  Neck: Normal range of motion.  Cardiovascular: Regular rhythm, normal heart sounds and intact distal pulses.   Pulmonary/Chest: Effort normal and breath sounds normal. No respiratory distress.  Abdominal: Soft. He exhibits no distension. There is no tenderness.  Musculoskeletal: Normal range of motion. He exhibits no edema or tenderness.  Full range of motion of bilateral shoulders, elbows and wrists. Full range of motion of bilateral hips, knees and ankles.    Neurological: He is alert.  Skin: Skin is warm and dry.  Psychiatric: He has a normal mood and affect. Judgment normal.  Nursing note and vitals reviewed.    ED Treatments /  Results  Labs (all labs ordered are listed, but only abnormal results are displayed) Labs Reviewed  COMPREHENSIVE METABOLIC PANEL - Abnormal; Notable for the following:       Result Value   Potassium 3.4 (*)    Glucose, Bld 109 (*)    All other components within normal limits  CBC WITH DIFFERENTIAL/PLATELET - Abnormal; Notable for the following:    Monocytes Absolute 1.4 (*)    All other components within normal limits  PROTIME-INR - Abnormal; Notable for the following:    Prothrombin Time 15.4 (*)    All other components within normal limits  URINALYSIS, ROUTINE W REFLEX MICROSCOPIC - Abnormal; Notable for the  following:    Protein, ur 30 (*)    Squamous Epithelial / LPF 0-5 (*)    All other components within normal limits  CULTURE, BLOOD (ROUTINE X 2)  CULTURE, BLOOD (ROUTINE X 2)  I-STAT CG4 LACTIC ACID, ED    EKG  EKG Interpretation None       Radiology Dg Chest 2 View  Result Date: 05/09/2017 CLINICAL DATA:  Fever and chills EXAM: CHEST  2 VIEW COMPARISON:  None. FINDINGS: Shallow lung inflation with bibasilar opacities. No pleural effusion or pneumothorax. The lateral radiograph is nondiagnostic due to respiratory motion. IMPRESSION: Bibasilar opacities, which may indicate atelectasis, though a viral infection could have the same appearance. Electronically Signed   By: Deatra Robinson M.D.   On: 05/09/2017 22:16   Dg Abd 2 Views  Result Date: 05/10/2017 CLINICAL DATA:  Fever, chills, vomiting beginning this morning. EXAM: ABDOMEN - 2 VIEW COMPARISON:  None. FINDINGS: The bowel gas pattern is normal. Moderate volume retained large bowel stool. There is no evidence of free air. No radio-opaque calculi or other significant radiographic abnormality is seen. IMPRESSION: Moderate volume retained large bowel stool, normal bowel gas pattern. Electronically Signed   By: Awilda Metro M.D.   On: 05/10/2017 06:12    Procedures Procedures (including critical care time)  Medications Ordered in ED Medications  acetaminophen (TYLENOL) 325 MG tablet (650 mg  Given 05/09/17 2127)  ondansetron (ZOFRAN) injection 4 mg (4 mg Intravenous Given 05/10/17 1610)  acetaminophen (TYLENOL) tablet 650 mg (650 mg Oral Given 05/10/17 0616)  sodium chloride 0.9 % bolus 1,500 mL (1,500 mLs Intravenous New Bag/Given 05/10/17 9604)     Initial Impression / Assessment and Plan / ED Course  I have reviewed the triage vital signs and the nursing notes.  Pertinent labs & imaging results that were available during my care of the patient were reviewed by me and considered in my medical decision making (see chart for  details).     Heart rate improved.  Fever improved.  Questionable pneumonia on chest x-ray.  Patient be treated with Levaquin liquid.  Family is comfortable taking the patient home.  Close primary care follow-up.  I've asked the father to return the patient to the emergency department for new or worsening symptoms.    Final Clinical Impressions(s) / ED Diagnoses   Final diagnoses:  Nausea  Vomiting  Fever, unspecified fever cause  Community acquired pneumonia, unspecified laterality    New Prescriptions New Prescriptions   LEVOFLOXACIN (LEVAQUIN) 25 MG/ML SOLUTION    Take 20 mLs (500 mg total) by mouth daily.     Azalia Bilis, MD 05/10/17 [phone]

## 2017-05-15 LAB — CULTURE, BLOOD (ROUTINE X 2)
CULTURE: NO GROWTH
Culture: NO GROWTH
Special Requests: ADEQUATE
Special Requests: ADEQUATE

## 2018-09-10 ENCOUNTER — Emergency Department (HOSPITAL_COMMUNITY): Payer: Medicare Other

## 2018-09-10 ENCOUNTER — Emergency Department (HOSPITAL_COMMUNITY)
Admission: EM | Admit: 2018-09-10 | Discharge: 2018-09-10 | Disposition: A | Discharge status: Home / Self Care | Payer: Medicare Other | Attending: Emergency Medicine | Admitting: Emergency Medicine

## 2018-09-10 ENCOUNTER — Encounter (HOSPITAL_COMMUNITY): Payer: Self-pay | Admitting: Emergency Medicine

## 2018-09-10 DIAGNOSIS — Z1881 Retained glass fragments: Secondary | ICD-10-CM | POA: Insufficient documentation

## 2018-09-10 DIAGNOSIS — S90852A Superficial foreign body, left foot, initial encounter: Secondary | ICD-10-CM | POA: Diagnosis not present

## 2018-09-10 DIAGNOSIS — S99922A Unspecified injury of left foot, initial encounter: Secondary | ICD-10-CM | POA: Diagnosis present

## 2018-09-10 DIAGNOSIS — X58XXXA Exposure to other specified factors, initial encounter: Secondary | ICD-10-CM | POA: Diagnosis not present

## 2018-09-10 DIAGNOSIS — Z79899 Other long term (current) drug therapy: Secondary | ICD-10-CM | POA: Insufficient documentation

## 2018-09-10 DIAGNOSIS — F840 Autistic disorder: Secondary | ICD-10-CM | POA: Insufficient documentation

## 2018-09-10 DIAGNOSIS — Y999 Unspecified external cause status: Secondary | ICD-10-CM | POA: Diagnosis not present

## 2018-09-10 DIAGNOSIS — Y939 Activity, unspecified: Secondary | ICD-10-CM | POA: Insufficient documentation

## 2018-09-10 DIAGNOSIS — R52 Pain, unspecified: Secondary | ICD-10-CM

## 2018-09-10 DIAGNOSIS — Y929 Unspecified place or not applicable: Secondary | ICD-10-CM | POA: Insufficient documentation

## 2018-09-10 MED ORDER — CIPROFLOXACIN HCL 500 MG PO TABS: 500.0000 mg | ORAL_TABLET | Freq: Two times a day (BID) | ORAL | 0 refills | Status: AC

## 2018-09-10 MED ORDER — HYDROCODONE-ACETAMINOPHEN 5-325 MG PO TABS
1.0000 | ORAL_TABLET | Freq: Once | ORAL | Status: AC
  Administered 2018-09-10: 1 via ORAL
  Filled 2018-09-10: qty 1

## 2018-09-10 MED ORDER — LIDOCAINE HCL (PF) 1 % IJ SOLN
10.0000 mL | Freq: Once | INTRAMUSCULAR | Status: AC
  Administered 2018-09-10: 10 mL via INTRADERMAL
  Filled 2018-09-10: qty 10

## 2018-09-10 MED ORDER — MIDAZOLAM HCL 2 MG/2ML IJ SOLN
5.0000 mg | Freq: Once | INTRAMUSCULAR | Status: AC
  Administered 2018-09-10: 5 mg via INTRAMUSCULAR
  Filled 2018-09-10: qty 6

## 2018-09-10 NOTE — Discharge Instructions (Addendum)
Keep the wound clean and dry for the first 24 hours. After that you may gently clean the wound with soap and water. Make sure to pat dry the wound before covering it with any dressing. You can use topical antibiotic ointment and bandage. Ice and elevate for pain relief.   You can take Tylenol or Ibuprofen as directed for pain. You can alternate Tylenol and Ibuprofen every 4 hours. If you take Tylenol at 1pm, then you can take Ibuprofen at 5pm. Then you can take Tylenol again at 9pm.   Take antibiotics as directed. Please take all of your antibiotics until finished.  Follow up with your primary care doctor in next 3-4 days for further evaluation.   Monitor closely for any signs of infection. Return to the Emergency Department for any worsening redness/swelling of the area that begins to spread, drainage from the site, worsening pain, fever or any other worsening or concerning symptoms.

## 2018-09-10 NOTE — ED Provider Notes (Signed)
[facility]   CSN: [id number] Arrival date & time: 09/10/18  1700     History   Chief Complaint Chief Complaint  Patient presents with  . Foot Pain    HPI Deanne CofferMohamed Meiners is a 22 y.o. male history of autism who presents for evaluation of left foot pain.  Mom states she first started noticing about 3 days ago.  She has a sore to the plantar surface of his left foot and states that she thinks that there is a piece of glass stuck in it.  Mom does not know how long it has been there.  Mom states that anytime she is tried to clean the foot or when patient has tried to walk, he has been limping.  Mom has not noted any fevers.  LEVEL 5 CAVEAT: DUE TO PATIENT'S NONVERBAL STATUS.   The history is provided by a relative. The history is limited by the condition of the patient.    Past Medical History:  Diagnosis Date  . Autism   . Autism     There are no active problems to display for this patient.   History reviewed. No pertinent surgical history.      Home Medications    Prior to Admission medications   Medication Sig Start Date End Date Taking? Authorizing Provider  ARIPiprazole (ABILIFY) 10 MG tablet Take 5 mg by mouth 2 (two) times daily. 04/10/17   [provider]  cholecalciferol (VITAMIN D) 1000 units tablet Take 1,000 Units by mouth daily. 04/10/17   [provider]  ciprofloxacin (CIPRO) 500 MG tablet Take 1 tablet (500 mg total) by mouth every 12 (twelve) hours for 7 days. 09/10/18 09/17/18  Maxwell CaulLayden, Lindsey A, PA-C  naproxen (NAPROSYN) 500 MG tablet Take 1 tablet (500 mg total) by mouth 2 (two) times daily with a meal. Patient not taking: Reported on 05/10/2017 06/16/14   Eber HongMiller, Brian, MD  propranolol (INDERAL) 20 MG tablet Take 20 mg by mouth 2 (two) times daily. 04/10/17   [provider]  topiramate (TOPAMAX) 25 MG capsule Take 75 mg by mouth 2 (two) times daily. 05/08/17   [provider]    Family History No family history on file.  Social History Social History   Tobacco Use  . Smoking status: Never Smoker  . Smokeless tobacco: Never Used  Substance Use Topics  . Alcohol use: No  . Drug use: No     Allergies   Patient has no known allergies.   Review of Systems Review of Systems  Unable to perform ROS: Patient nonverbal     Physical Exam Updated Vital Signs BP (!) 142/83 (BP Location: Right Arm)   Pulse 90   Temp 98.4 F (36.9 C) (Axillary)   Resp 18   Ht 5\' 8"  (1.727 m)   Wt 101 kg   SpO2 100%   BMI 33.86 kg/m   Physical Exam  Constitutional: He appears well-developed and well-nourished.  HENT:  Head: Normocephalic and atraumatic.  Eyes: Conjunctivae and EOM are normal. Right eye exhibits no discharge. Left eye exhibits no discharge. No scleral icterus.  Pulmonary/Chest: Effort normal.  Neurological: He is alert.  Skin: Skin is warm and dry.  Small 0.5 cm wound noted to plantar surface of the left foot.  No surrounding warmth, erythema.  Small piece of glass protruding from the wound.  Psychiatric: He has a normal mood and affect. His speech is normal and behavior is normal.  Nursing  note and vitals reviewed.    ED Treatments / Results  Labs (all labs ordered are listed, but only abnormal results are displayed) Labs Reviewed - No data to display  EKG None  Radiology Dg Foot 2 Views Left  Result Date: 09/10/2018 CLINICAL DATA:  On broken glass dish 3 days ago, question glass fragment/foreign body EXAM: LEFT FOOT - 2 VIEW COMPARISON:  06/16/2014 FINDINGS: Single lateral view. Triangular foreign body 8 x 2 mm in size identified at the plantar soft tissues of the mid to hind LEFT foot consistent with a glass fragment. Remaining soft tissues unremarkable. Osseous structures unremarkable. IMPRESSION: 8 x 2 mm radiopaque foreign body/glass fragment at the plantar aspect of the LEFT foot. Electronically Signed   By: Ulyses Southward M.D.   On: 09/10/2018 19:09    Procedures .Foreign Body Removal Date/Time: 09/10/2018 8:36 PM Performed by: Maxwell Caul, PA-C Authorized by: Maxwell Caul, PA-C  Consent: Verbal consent obtained. Risks and benefits: risks, benefits and alternatives were discussed Consent given by: parent Patient understanding: patient states understanding of the procedure being performed Patient consent: the patient's understanding of the procedure matches consent given Procedure consent: procedure consent matches procedure scheduled Relevant documents: relevant documents present and verified Test results: test results available and properly labeled Site marked: the operative site was marked Imaging studies: imaging studies available Patient identity confirmed: arm band Body area: skin General location: lower extremity Complexity: simple 1 objects recovered. Objects recovered: piece of glass Post-procedure assessment: foreign body removed Patient tolerance: Patient tolerated the procedure well with no immediate complications Comments: Patient was given IM Versed to help with relaxation.  Once Versed take effect, soft restraints were used to obtain his feet to the bed.  The glass was identified and removed using a pair of forceps tweezers.  I evaluated the wound once the foreign body was removed and sent no more evidence of retained glass.  The area was irrigated with sterile saline.  Patient had difficulty tolerating irrigation.  Mom took over and washed with sterile saline and clean towel.   (including critical care time)  Medications Ordered in ED Medications  HYDROcodone-acetaminophen (NORCO/VICODIN) 5-325 MG per tablet 1 tablet (1 tablet Oral Given 09/10/18 1830)  lidocaine (PF) (XYLOCAINE) 1 % injection 10 mL (10 mLs Intradermal Given 09/10/18 2038)  midazolam (VERSED) injection 5 mg (5 mg Intramuscular Given 09/10/18 1955)     Initial Impression / Assessment and Plan / ED  Course  I have reviewed the triage vital signs and the nursing notes.  Pertinent labs & imaging results that were available during my care of the patient were reviewed by me and considered in my medical decision making (see chart for details).     22 year old male who presents for evaluation of foreign body in left foot.  Mom reports he noticed that he was limping on it for the last 3 days.  She thinks there is pizza last night.  No fevers.  Evaluation of the foot, there does appear to be a small wound about 1.5 cm in diameter with a small foreign body sticking out.  We will plan to get imaging for evaluation.  His last Tdap was in 2015.  Patient with a 8 x 2 minimally radiopaque foreign body noted in the plantar aspect of left foot.  Discussed with mom.  Patient is very aggressive and is nonverbal at baseline given history of autism.  He would not let me evaluate the foot which makes it difficult  to remove the foreign body.  Discussed with the ED attending.  Will give dose of IM Versed and use soft restraints.  Foreign body removed as documented above.  Patient tolerated procedure well.  Wound was irrigated the best of our ability.  Patient did not tolerate irrigation very well so it was stopped.  Mom took over cleaning the wound.  Will start on antibiotics.  Patient with no known drug allergies.  Instructed mom to follow-up with primary care.  Wound care precautions discussed. Patient had ample opportunity for questions and discussion. All patient's questions were answered with full understanding. Strict return precautions discussed. Patient expresses understanding and agreement to plan.    Final Clinical Impressions(s) / ED Diagnoses   Final diagnoses:  Foreign body in left foot, initial encounter    ED Discharge Orders         Ordered    ciprofloxacin (CIPRO) 500 MG tablet  Every 12 hours     09/10/18 2041           Maxwell Caul, PA-C 09/10/18 2306    Shaune Pollack,  MD 09/11/18 [phone]

## 2018-09-10 NOTE — ED Triage Notes (Signed)
Pt has pain in L foot. Family stated patient started limping 3 days ago. Closed small wound on L heel. Patient nonverbal baseline pt has autism. Unknown last tetanus

## 2018-09-10 NOTE — ED Notes (Signed)
Patient verbalizes understanding of discharge instructions. Opportunity for questioning and answers were provided. Armband removed by staff, pt discharged from ED ambulatory to home.  

## 2019-05-09 IMAGING — DX DG FOOT 2V*L*
1 series · 1 of 1 positions shown · non-contrast
Comparison: 06/16/2014

CLINICAL DATA: On broken glass dish 3 days ago, question glass
fragment/foreign body

EXAM:
LEFT FOOT - 2 VIEW

[foot lat]
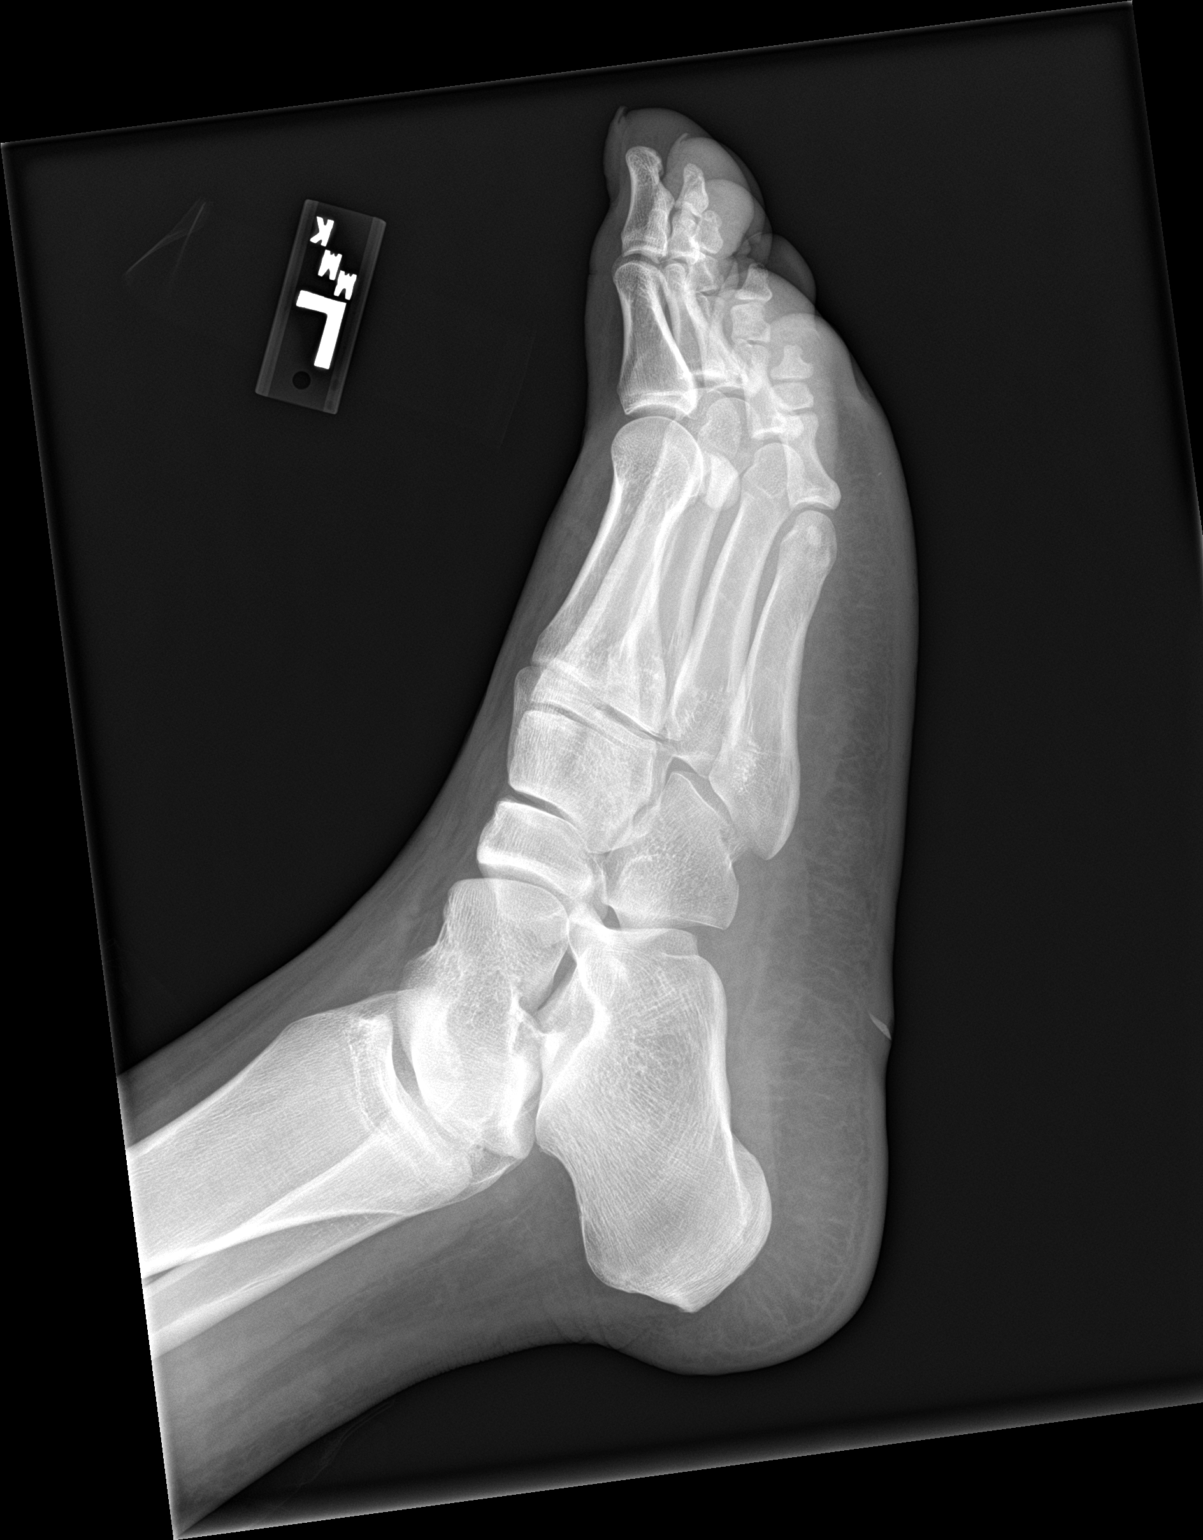

[1 of 1 positions shown; findings below may reference images not displayed]

FINDINGS: Single lateral view.

Triangular foreign body 8 x 2 mm in size identified at the plantar
soft tissues of the mid to hind LEFT foot consistent with a glass
fragment.

Remaining soft tissues unremarkable.

Osseous structures unremarkable.
IMPRESSION: 8 x 2 mm radiopaque foreign body/glass fragment at the plantar
aspect of the LEFT foot.
